# Patient Record
Sex: Female | Born: 2000 | Race: White | Hispanic: No | Marital: Single | Smoking: Never smoker
Health system: Southern US, Community
[De-identification: ages and names within clinical notes are randomized; demographics above are authoritative.]

## PROBLEM LIST (undated history)

## (undated) HISTORY — PX: NO PAST SURGERIES: SHX2092

---
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
## 2021-03-25 IMAGING — CT CT RENAL STONE PROTOCOL
2 of 4 series · 16 of 46 positions shown, 18 images · non-contrast
Comparison: None.

CLINICAL DATA: Flank pain, stone disease suspected, right flank
pain for 3 days

EXAM:
CT ABDOMEN AND PELVIS WITHOUT CONTRAST
TECHNIQUE: Multidetector CT imaging of the abdomen and pelvis was performed
following the standard protocol without IV contrast.

[Series 3: stone study 5.0 i30f 2 · axial · 0.82mm/px · z∈[+778,+1218]mm · 13 of 96 slices shown, 15 images]
[im 4/96  soft-tissue]
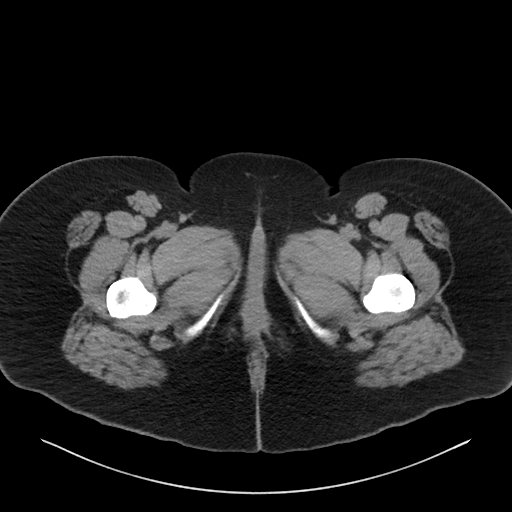
[im 4/96  bone]
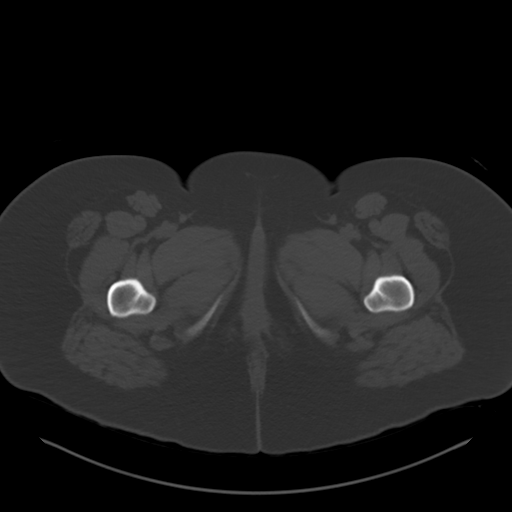
[im 12/96  soft-tissue]
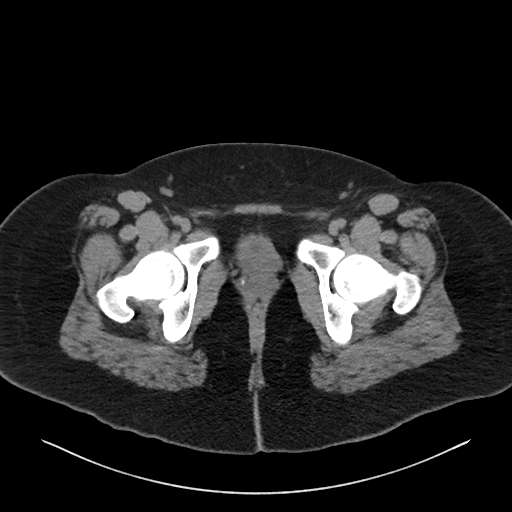
[im 20/96  soft-tissue]
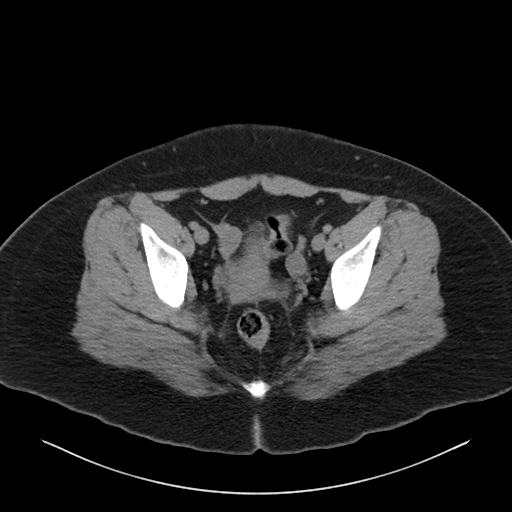
[im 28/96  soft-tissue]
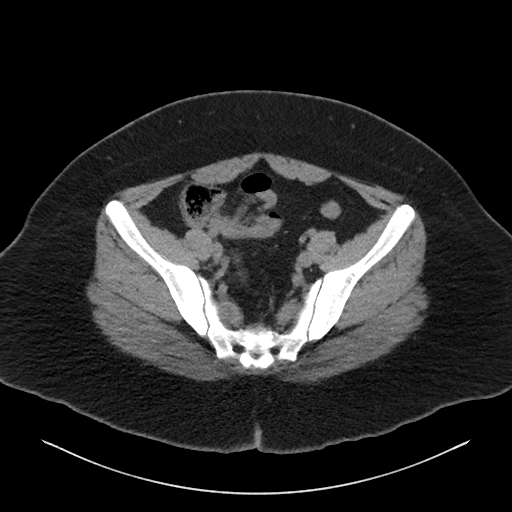
[im 32/96  soft-tissue]
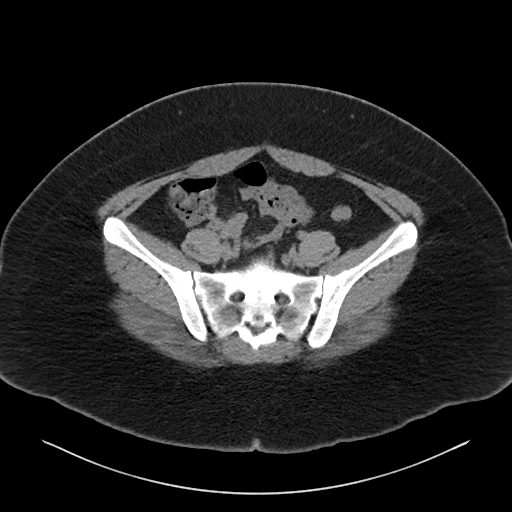
[im 40/96  soft-tissue]
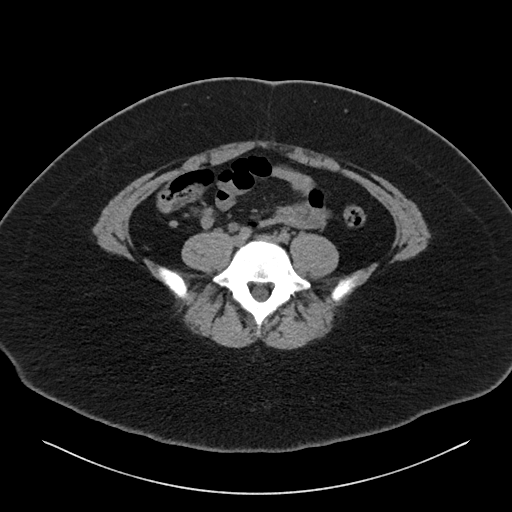
[im 48/96  soft-tissue]
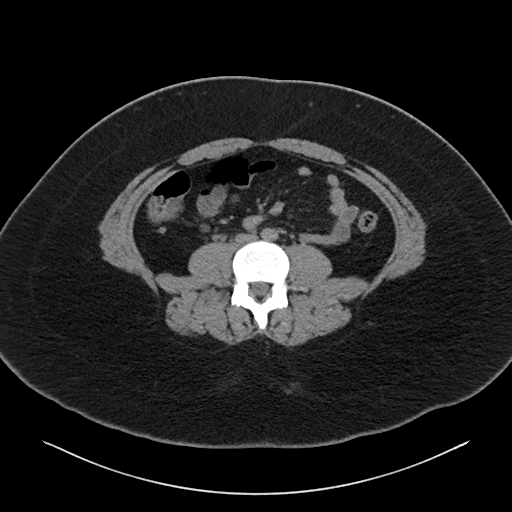
[im 56/96  soft-tissue]
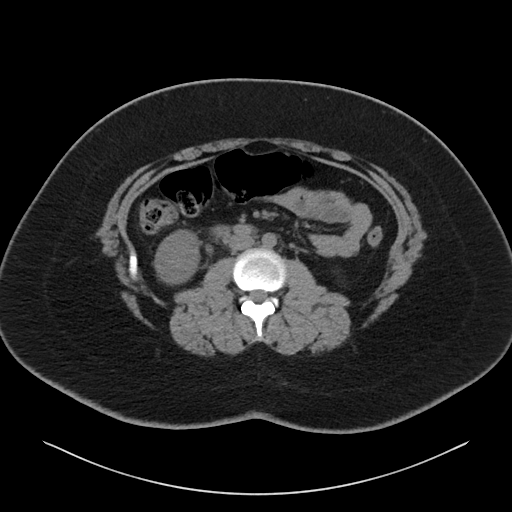
[im 64/96  soft-tissue]
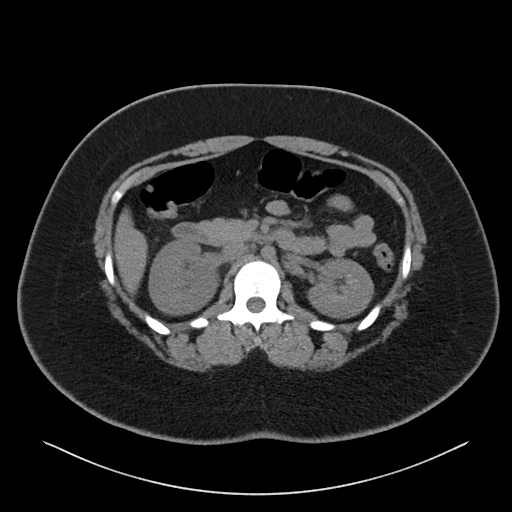
[im 64/96  bone]
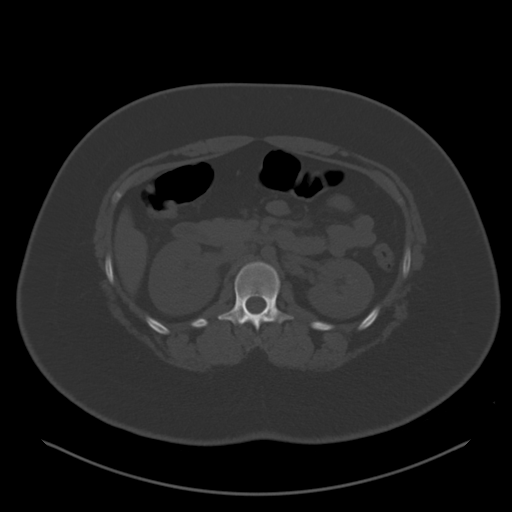
[im 68/96  soft-tissue]
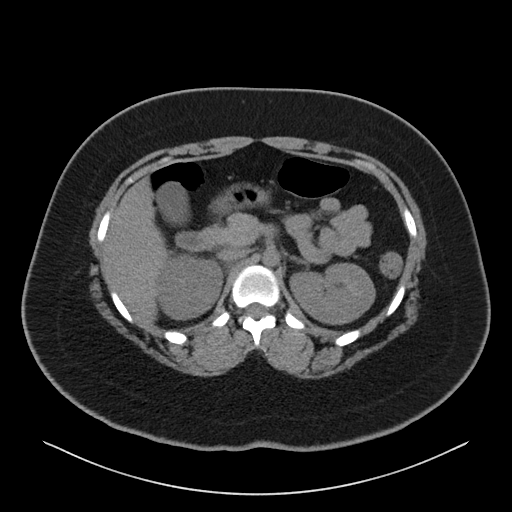
[im 76/96  soft-tissue]
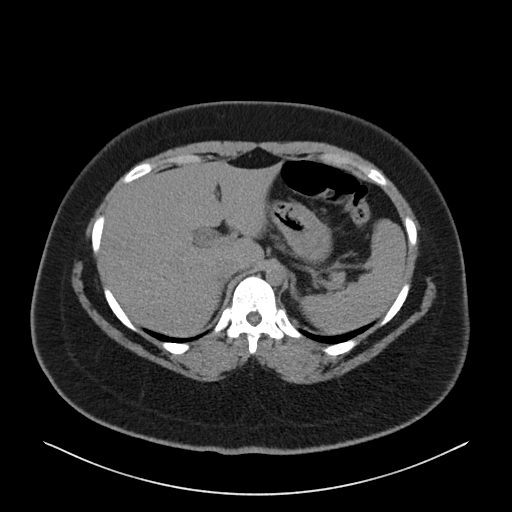
[im 84/96  soft-tissue]
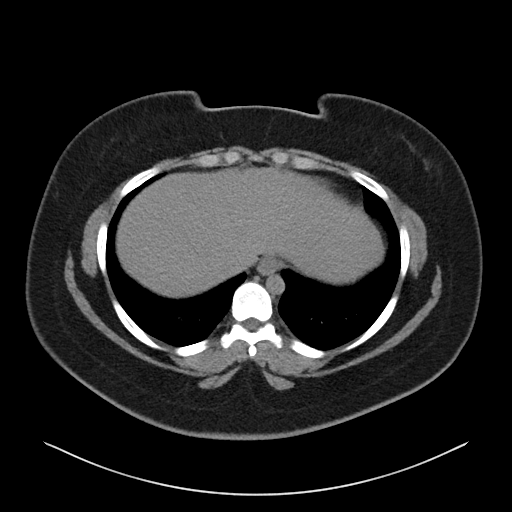
[im 92/96  soft-tissue]
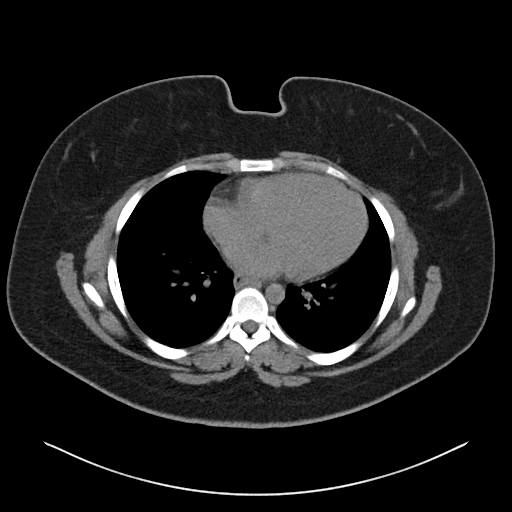

[Series 6: coronal soft tissue · coronal · 0.93mm/px · 3 of 101 slices shown]
[im 34/101  soft-tissue]
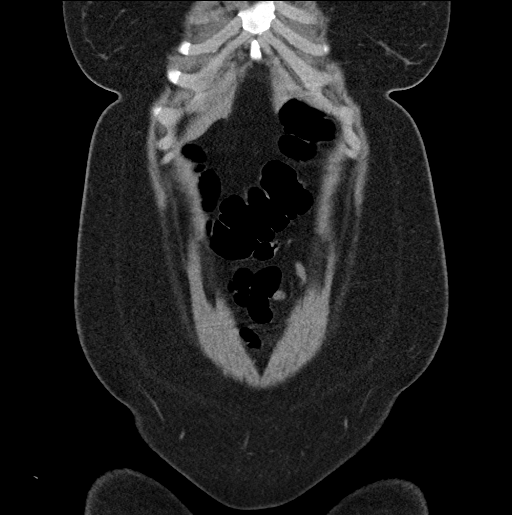
[im 45/101  soft-tissue]
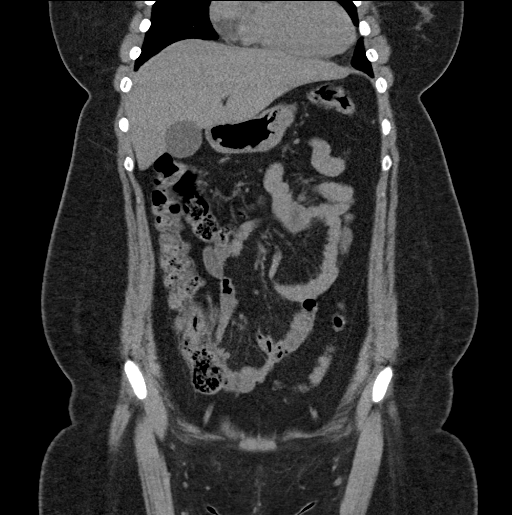
[im 56/101  soft-tissue]
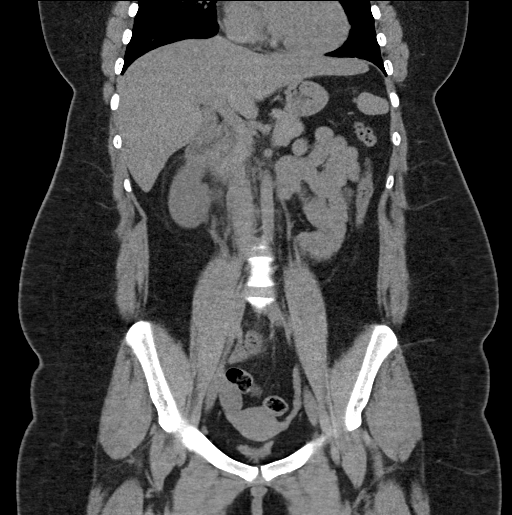

[16 of 46 positions shown; findings below may reference images not displayed]

FINDINGS: Lower chest: Lung bases are clear. Normal heart size. No pericardial
effusion.

Hepatobiliary: No focal liver abnormality. Partially calcified
gallstone noted at the gallbladder neck. No pericholecystic
inflammation or wall thickening. No biliary ductal dilatation.

Pancreas: Unremarkable. No pancreatic ductal dilatation or
surrounding inflammatory changes.

Spleen: Normal in size without focal abnormality.

Adrenals/Urinary Tract: Normal adrenal glands.

Asymmetric right perinephric and periureteral stranding with a
diffusely hypoattenuating appearance of the right kidney. There is
mild to moderate right hydroureteronephrosis to the level of a 2 mm
calculus at the right UVJ (3/81). No additional urolithiasis no
visible or contour deforming renal lesions. Urinary bladder is
largely decompressed at the time of exam and therefore poorly
evaluated by CT imaging.

Stomach/Bowel: Distal esophagus, stomach and duodenal sweep are
unremarkable. No bowel wall thickening or dilatation. No evidence of
obstruction. A normal appendix is visualized. Scattered colonic
diverticula without focal pericolonic inflammation to suggest
diverticulitis.

Vascular/Lymphatic: No significant vascular findings are present. No
enlarged abdominal or pelvic lymph nodes.

Reproductive: Normal anteverted uterus. No concerning adnexal
lesions.

Other: No abdominopelvic free fluid or free gas. No bowel containing
hernias.

Musculoskeletal: No acute or significant osseous findings.
IMPRESSION: 1. Mild to moderate right hydroureteronephrosis to the level of a 2
mm calculus at the right UVJ. Relative hypoattenuation of the right
kidney relative to the left may be physiologic, related to ureteral
obstruction though recommend correlation with urinalysis to exclude
superimposed infection.
2. Cholelithiasis without evidence of acute cholecystitis or biliary
ductal dilatation.
3. Colonic diverticulosis without evidence of diverticulitis.

## 2021-11-21 ENCOUNTER — Emergency Department (HOSPITAL_COMMUNITY): Payer: Medicaid Other

## 2021-11-21 ENCOUNTER — Emergency Department (HOSPITAL_COMMUNITY)
Admission: EM | Admit: 2021-11-21 | Discharge: 2021-11-21 | Disposition: A | Discharge status: Home / Self Care | Payer: Medicaid Other | Attending: Emergency Medicine | Admitting: Emergency Medicine

## 2021-11-21 ENCOUNTER — Encounter (HOSPITAL_COMMUNITY): Payer: Self-pay | Admitting: Emergency Medicine

## 2021-11-21 ENCOUNTER — Other Ambulatory Visit: Payer: Self-pay

## 2021-11-21 DIAGNOSIS — Z23 Encounter for immunization: Secondary | ICD-10-CM | POA: Diagnosis not present

## 2021-11-21 DIAGNOSIS — W540XXA Bitten by dog, initial encounter: Secondary | ICD-10-CM | POA: Diagnosis not present

## 2021-11-21 DIAGNOSIS — L03113 Cellulitis of right upper limb: Secondary | ICD-10-CM | POA: Insufficient documentation

## 2021-11-21 DIAGNOSIS — S61451A Open bite of right hand, initial encounter: Secondary | ICD-10-CM

## 2021-11-21 DIAGNOSIS — S61431A Puncture wound without foreign body of right hand, initial encounter: Secondary | ICD-10-CM | POA: Insufficient documentation

## 2021-11-21 MED ORDER — AMOXICILLIN-POT CLAVULANATE 875-125 MG PO TABS: 1.0000 | ORAL_TABLET | Freq: Two times a day (BID) | ORAL | 0 refills | Status: AC

## 2021-11-21 MED ORDER — AMOXICILLIN-POT CLAVULANATE 875-125 MG PO TABS
1.0000 | ORAL_TABLET | Freq: Once | ORAL | Status: AC
  Administered 2021-11-21: 1 via ORAL
  Filled 2021-11-21: qty 1

## 2021-11-21 MED ORDER — TETANUS-DIPHTH-ACELL PERTUSSIS 5-2.5-18.5 LF-MCG/0.5 IM SUSY
0.5000 mL | PREFILLED_SYRINGE | Freq: Once | INTRAMUSCULAR | Status: AC
  Administered 2021-11-21: 0.5 mL via INTRAMUSCULAR
  Filled 2021-11-21: qty 0.5

## 2021-11-21 NOTE — Discharge Instructions (Addendum)
Your exam overall today was reassuring.  Your tetanus shot was updated today.  Augmentin was prescribed.  You received your first dose in the emergency room today.  I discussed your case with hand specialist Dr. Carola Frost.  He would like to see you in his clinic Wednesday for follow-up.  Please give his office a call either tomorrow or Tuesday if they are closed tomorrow to get a time for a clinic appointment.  If you have significant worsening in your symptoms in the meantime please return to the emergency room.

## 2021-11-21 NOTE — ED Triage Notes (Signed)
Patient arrives ambulatory by POV with mother stating she was bit in right hand Thursday by a dog at work. Patient reports right hand swelling and redness increasing since then. States dog is up to date with all shots. Took 2 ibuprofen and 1 tylenol this morning.

## 2021-11-21 NOTE — ED Provider Notes (Signed)
[facility] EMERGENCY DEPARTMENT Provider Note   CSN: [id number] Arrival date & time: 11/21/21  1347     History  Chief Complaint  Patient presents with   Animal Bite    Laurie Garza is a 21 y.o. female.  21 year old left-hand-dominant female presents with her mom today for evaluation of dog bite that occurred Thursday evening.  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  The dog was up-to-date on all of its vaccinations.  She denies fever, chills.  Mild pain that she has been taking Tylenol and ibuprofen for with decent control.  Initially when the incident happened the puncture wound on the dorsum of the hand had mild bleeding.  Reports significant cleaning of the wound at the time of the injury.  She has been using Neosporin on the wound since then.  She also has a lesion to the palm of her hand but denies any bleeding at the time of the incident.  She states the lesion on the palm of the hand did not break the skin.  Since the incident she has had erythema to the dorsal aspect of the hand.  The history is provided by the patient. No language interpreter was used.       Home Medications Prior to Admission medications   Medication Sig Start Date End Date Taking? Authorizing Provider  ibuprofen (ADVIL,MOTRIN) 400 MG tablet TK 1 T PO  TID PRN 05/24/18   [provider]  ketorolac (TORADOL) 10 MG tablet Take 1 tablet (10 mg total) by mouth every 6 (six) hours as needed. 11/15/18   Hedges, Tinnie Gens, PA-C  Magnesium Oxide 500 MG TABS Take 1 tablet (500 mg total) by mouth daily. Patient not taking: Reported on 09/17/2018 05/30/18   Keturah Shavers, MD  oxyCODONE-acetaminophen (PERCOCET/ROXICET) 5-325 MG tablet Take 1 tablet by mouth every 6 (six) hours as needed. 11/15/18   Hedges, Tinnie Gens, PA-C  riboflavin (VITAMIN B-2) 100 MG TABS tablet Take 1 tablet (100 mg total) by mouth daily. Patient not taking: Reported on 09/17/2018 05/30/18   Keturah Shavers, MD  tamsulosin (FLOMAX)  0.4 MG CAPS capsule Take 1 capsule (0.4 mg total) by mouth daily. 11/15/18   Hedges, Tinnie Gens, PA-C  topiramate (TOPAMAX) 50 MG tablet Take 50 mg in a.m. and 100 mg in p.m. 09/17/18   Keturah Shavers, MD      Allergies    Patient has no known allergies.    Review of Systems   Review of Systems  Constitutional:  Negative for chills and fever.  Musculoskeletal:  Negative for arthralgias.  Skin:  Positive for wound.  All other systems reviewed and are negative.   Physical Exam Updated Vital Signs BP 129/78 (BP Location: Left Arm)   Pulse 74   Temp 98.6 F (37 C) (Oral)   Resp 16   Ht 5\' 2"  (1.575 m)   Wt 108.9 kg   LMP 11/02/2021   SpO2 100%   BMI 43.90 kg/m  Physical Exam Vitals and nursing note reviewed.  Constitutional:      General: She is not in acute distress.    Appearance: Normal appearance. She is not ill-appearing.  HENT:     Head: Normocephalic and atraumatic.     Nose: Nose normal.  Eyes:     Conjunctiva/sclera: Conjunctivae normal.  Cardiovascular:     Rate and Rhythm: Normal rate and regular rhythm.  Pulmonary:     Effort: Pulmonary effort is normal. No respiratory distress.  Musculoskeletal:  General: No deformity.     Comments: Puncture wound noted to dorsal and palmar aspect of the right hand.  Surrounding erythema and swelling to the dorsal aspect of the hand.  Mild tenderness to palpation present surrounding the dorsal of the hand.  Good range of motion in all digits of the hand.  Full flexion of all digits limited secondary to swelling.  No pain with range of motion of the digits.  2+ radial pulse present.  Neurovascularly intact.  The wound on the palmar aspect is without surrounding erythema or swelling.  Without tenderness to palpation over flexor tendon sheath of any digits of the right hand.  Right wrist with full range of motion and without tenderness to palpation.  Right elbow with full range of motion and without tenderness to palpation.   Without tenderness to palpation and soft compartments of the right forearm.  Right forearm without erythema.  Skin:    Findings: No rash.  Neurological:     Mental Status: She is alert.         ED Results / Procedures / Treatments   Labs (all labs ordered are listed, but only abnormal results are displayed) Labs Reviewed - No data to display  EKG None  Radiology DG Hand Complete Right  Result Date: 11/21/2021 CLINICAL DATA:  Bitten by a dog on her right hand on 11/18/2021. And has and swollen and more recently gotten red. EXAM: RIGHT HAND - COMPLETE 3+ VIEW COMPARISON:  None Available. FINDINGS: No fracture. No bone lesion. No bone resorption to suggest osteomyelitis. Joints are normally spaced and aligned. Dorsal soft tissue swelling. No soft tissue air. No radiopaque foreign body. IMPRESSION: 1. No fracture.  No evidence of osteomyelitis. 2. Soft tissue swelling. No soft tissue air or radiopaque foreign body. Electronically Signed   By: Lajean Manes M.D.   On: 11/21/2021 14:35    Procedures Procedures    Medications Ordered in ED Medications - No data to display  ED Course/ Medical Decision Making/ A&P                           Medical Decision Making Amount and/or Complexity of Data Reviewed Radiology: ordered.   Medical Decision Making / ED Course   This patient presents to the ED for concern of dog bite, this involves an extensive number of treatment options, and is a complaint that carries with it a high risk of complications and morbidity.  The differential diagnosis includes cellulitis, septic joint  MDM: 21 year old female presents with her mom for evaluation of dog bite to right hand.  This occurred on Thursday.  Denies fever, chills.  There is swelling and local erythema.  Overall good range of motion of the right hand including all digits of the right hand.  There is slight limitation in full flexion of the right hand due to swelling.  Neurovascularly intact.   Low suspicion for septic joint as right wrist is without tenderness to palpation, overall good range of motion in all digits of the right hand.  Case discussed with Dr. Ginette Pitman with hand surgery who is in agreement with plan.  He will arrange for a follow-up in the clinic for Wednesday.  Patient is appropriate for discharge.  Discharged in stable condition.  Strict return precautions discussed.  Patient discharged on Augmentin first dose given in the emergency room.  Tetanus shot updated today.    Lab Tests: -I ordered, reviewed, and interpreted labs.  The pertinent results include:   Labs Reviewed - No data to display    EKG  EKG Interpretation  Date/Time:    Ventricular Rate:    PR Interval:    QRS Duration:   QT Interval:    QTC Calculation:   R Axis:     Text Interpretation:           Imaging Studies ordered: I ordered imaging studies including right hand x ray I independently visualized and interpreted imaging. I agree with the radiologist interpretation   Medicines ordered and prescription drug management: No orders of the defined types were placed in this encounter.   -I have reviewed the patients home medicines and have made adjustments as needed  Reevaluation: After the interventions noted above, I reevaluated the patient and found that they have :stayed the same  Co morbidities that complicate the patient evaluation History reviewed. No pertinent past medical history.    Dispostion: Patient is appropriate for discharge.  Discharged in stable condition.  Return precautions discussed.  Final Clinical Impression(s) / ED Diagnoses Final diagnoses:  Dog bite of right hand, initial encounter  Cellulitis of right upper extremity    Rx / DC Orders ED Discharge Orders          Ordered    amoxicillin-clavulanate (AUGMENTIN) 875-125 MG tablet  Every 12 hours        11/21/21 1614              Marita [place], PA-C 11/21/21 1617    Mardene Sayer,  MD 11/21/21 2358
